# Patient Record
Sex: Male | Born: 1995 | Race: White | Hispanic: No | Marital: Single | Smoking: Never smoker
Health system: Southern US, Community
[De-identification: ages and names within clinical notes are randomized; demographics above are authoritative.]

## PROBLEM LIST (undated history)

## (undated) DIAGNOSIS — F50819 Binge eating disorder, unspecified: Secondary | ICD-10-CM

## (undated) DIAGNOSIS — F5081 Binge eating disorder: Secondary | ICD-10-CM

## (undated) DIAGNOSIS — F4329 Adjustment disorder with other symptoms: Secondary | ICD-10-CM

## (undated) HISTORY — DX: Binge eating disorder: F50.81

## (undated) HISTORY — DX: Binge eating disorder, unspecified: F50.819

## (undated) HISTORY — PX: KIDNEY SURGERY: SHX687

## (undated) HISTORY — DX: Adjustment disorder with other symptoms: F43.29

---
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
## 2014-10-18 ENCOUNTER — Encounter: Payer: Self-pay | Admitting: Family Medicine

## 2014-10-18 ENCOUNTER — Ambulatory Visit (INDEPENDENT_AMBULATORY_CARE_PROVIDER_SITE_OTHER): Payer: BC Managed Care – PPO | Admitting: Family Medicine

## 2014-10-18 VITALS — BP 116/77 | HR 83 | Temp 98.8°F | Resp 16 | Ht 66.75 in | Wt 159.0 lb

## 2014-10-18 DIAGNOSIS — R41840 Attention and concentration deficit: Secondary | ICD-10-CM

## 2014-10-18 DIAGNOSIS — F508 Other eating disorders: Secondary | ICD-10-CM | POA: Diagnosis not present

## 2014-10-18 DIAGNOSIS — F411 Generalized anxiety disorder: Secondary | ICD-10-CM | POA: Diagnosis not present

## 2014-10-18 DIAGNOSIS — F5081 Binge eating disorder: Secondary | ICD-10-CM

## 2014-10-18 MED ORDER — LISDEXAMFETAMINE DIMESYLATE 30 MG PO CAPS: 30.0000 mg | ORAL_CAPSULE | Freq: Every day | ORAL | Status: DC

## 2014-10-18 NOTE — Progress Notes (Signed)
Office Note 10/18/2014  CC:  Chief Complaint  Patient presents with  . Establish Care   HPI:  Brian Benson is a 19 y.o. White male who is here to establish care. Patient's most recent primary MD: Dr. Yehuda Budd (no longer practicing medicine). Old records were not reviewed prior to or during today's visit.  Here today b/c he is returning to college today and has concerns that he has ADD. Says he struggled with focus since HS, started affecting his grades since college.  Trouble grasping info in lectures.   No oppositinal/defiance tendencies.  Never labeled as hyper in HS.  Has some feeling of being driven by a motor. Some probs with impulsivity, talking when he shouldn't be talking.   Gets what he calls stress induced anxiety.  Some social anxiety.  No panic attack but "debilitating stress" in the past. During times of extreme stress he finds it very had to sleep, sometimes is up for 2 days (like during final exam week). Endorces past hx of depression but has never been medicated.  Says no current depressed feelings.     Past Medical History  Diagnosis Date  . Binge eating disorder     in remission as of 10/2014    Past Surgical History  Procedure Laterality Date  . Kidney surgery      82 weeks old---ureteral blockage--repaired and both kidneys essentially work normal since.    Family History  Problem Relation Age of Onset  . Diabetes Mother     Type 1 secondary to pancreatitis  . Arthritis Father   . Stroke Paternal Grandmother   . Heart disease Paternal Grandfather   . Hypertension Paternal Grandfather   . Heart attack Paternal Grandfather     Social History   Social History  . Marital Status: Single    Spouse Name: N/A  . Number of Children: N/A  . Years of Education: N/A   Occupational History  . Not on file.   Social History Main Topics  . Smoking status: Never Smoker   . Smokeless tobacco: Never Used  . Alcohol Use: No  . Drug Use: No  . Sexual  Activity: Not on file   Other Topics Concern  . Not on file   Social History Narrative   Single, no children.   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   No T/A/Ds.   MEDS: none  No Known Allergies  ROS Review of Systems  Constitutional: Negative for fever and fatigue.  HENT: Negative for congestion and sore throat.   Eyes: Negative for visual disturbance.  Respiratory: Negative for cough.   Cardiovascular: Negative for chest pain.  Gastrointestinal: Negative for nausea and abdominal pain.  Genitourinary: Negative for dysuria.  Musculoskeletal: Negative for back pain and joint swelling.  Skin: Negative for rash.  Neurological: Negative for weakness and headaches.  Hematological: Negative for adenopathy.    PE; Blood pressure 116/77, pulse 83, temperature 98.8 F (37.1 C), temperature source Oral, resp. rate 16, height 5' 6.75" (1.695 m), weight 159 lb (72.122 kg), SpO2 98 %. Wt Readings from Last 2 Encounters:  10/18/14 159 lb (72.122 kg)  07/18/12 200 lb (90.719 kg) (94 %*, Z = 1.57)   * Growth percentiles are based on CDC 2-20 Years data.    Gen: alert, oriented x 4, affect pleasant.  Lucid thinking and conversation noted. HEENT: PERRLA, EOMI.   Neck: no LAD, mass, or thyromegaly. CV: RRR, no m/r/g LUNGS: CTA bilat, nonlabored. NEURO: no  tremor or tics noted on observation.  Coordination intact. CN 2-12 grossly intact bilaterally, strength 5/5 in all extremeties.  No ataxia.  Pertinent labs:  none  ASSESSMENT AND PLAN:   GAD with some concentration/focus deficit as symptoms. He also admits to binge eating d/o that he feels like is in remission at this time. Plan: start vyvanse 30mg  qd trial for treatment of binge eating disorder and concentration/focus deficit.  Therapeutic expectations and side effect profile of medication discussed today.  Patient's questions answered. If anxiety is made worse by the vyvanse then will likely do trial of strattera or  an SSRI.  An After Visit Summary was printed and given to the patient.  Return for f/u adhd/anxiety in 3-4 wks.

## 2014-10-18 NOTE — Progress Notes (Signed)
Pre visit review using our clinic review tool, if applicable. No additional management support is needed unless otherwise documented below in the visit note. 

## 2014-10-31 ENCOUNTER — Telehealth: Payer: Self-pay | Admitting: *Deleted

## 2014-10-31 IMAGING — CR DG SHOULDER 2+V*L*
3 series · 3 of 3 positions shown · non-contrast
Comparison: None available.

CLINICAL DATA: Pain.  Trauma the left shoulder 3 days ago.

LEFT SHOULDER - 2+ VIEW

[view not recorded (1 of 3)]
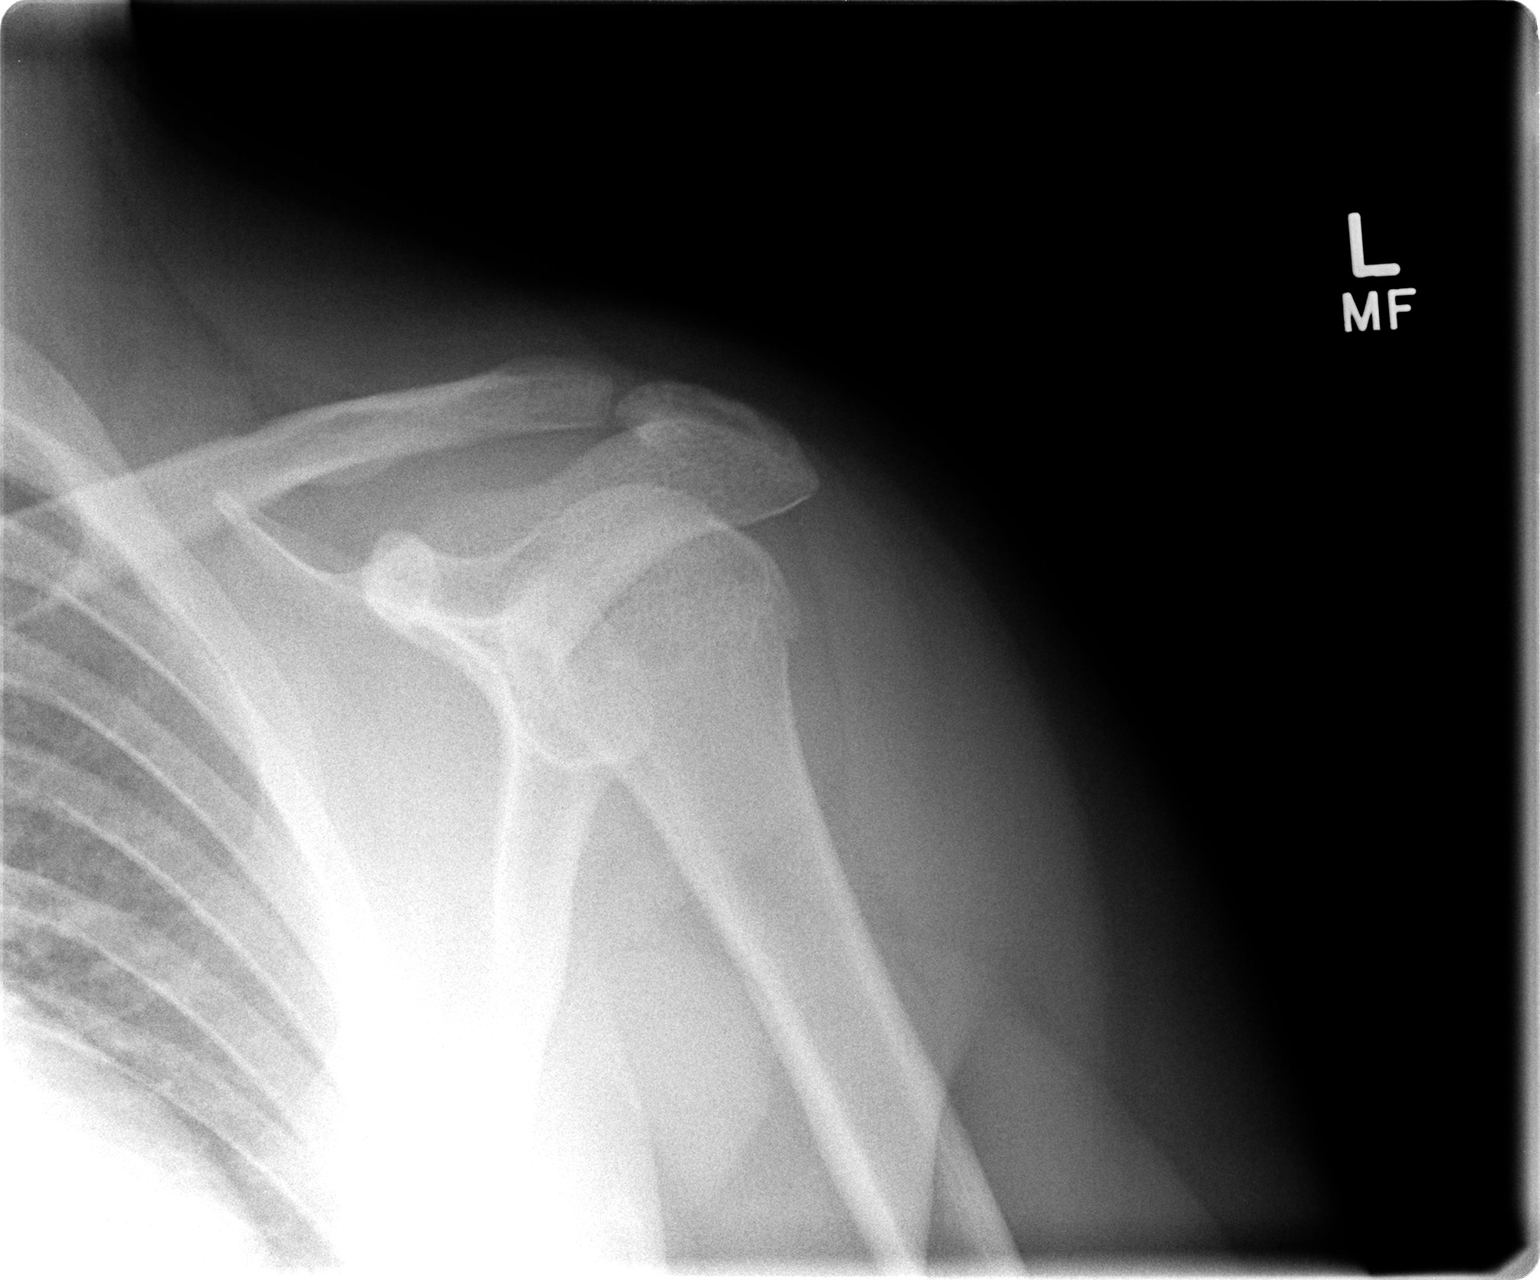

[view not recorded (2 of 3)]
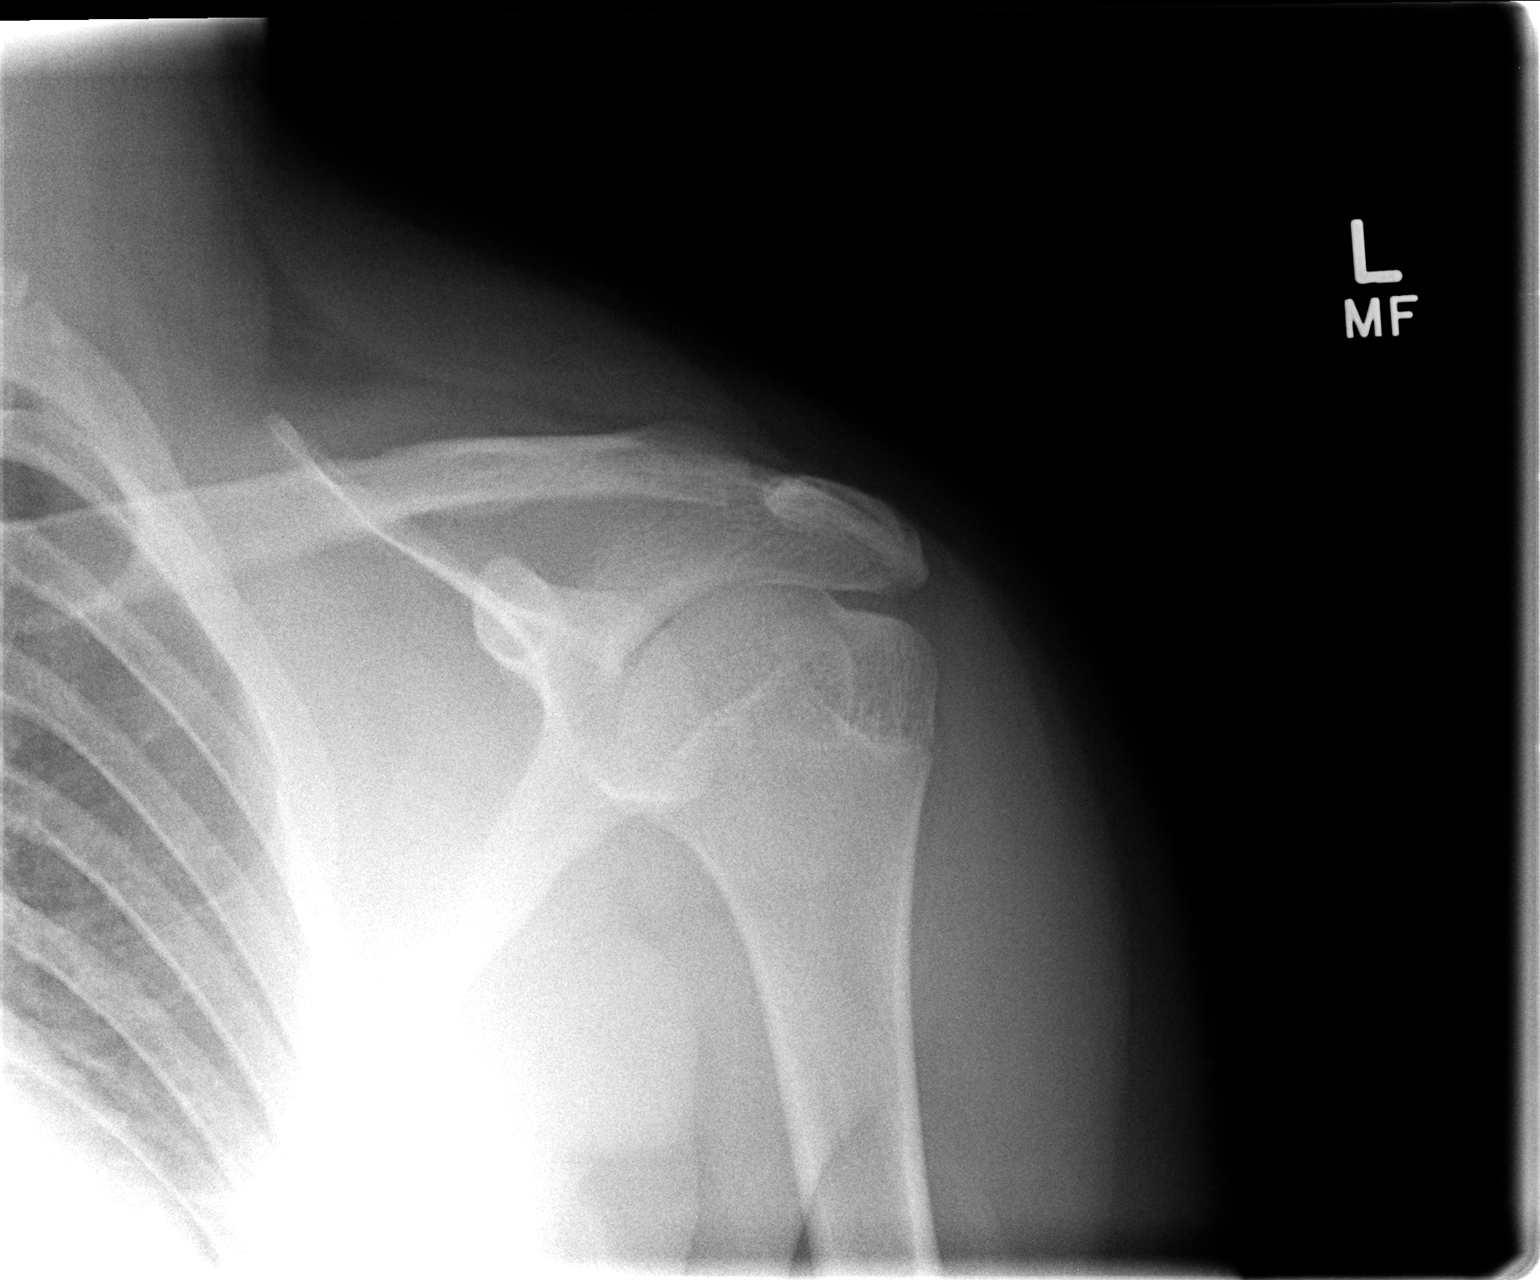

[view not recorded (3 of 3)]
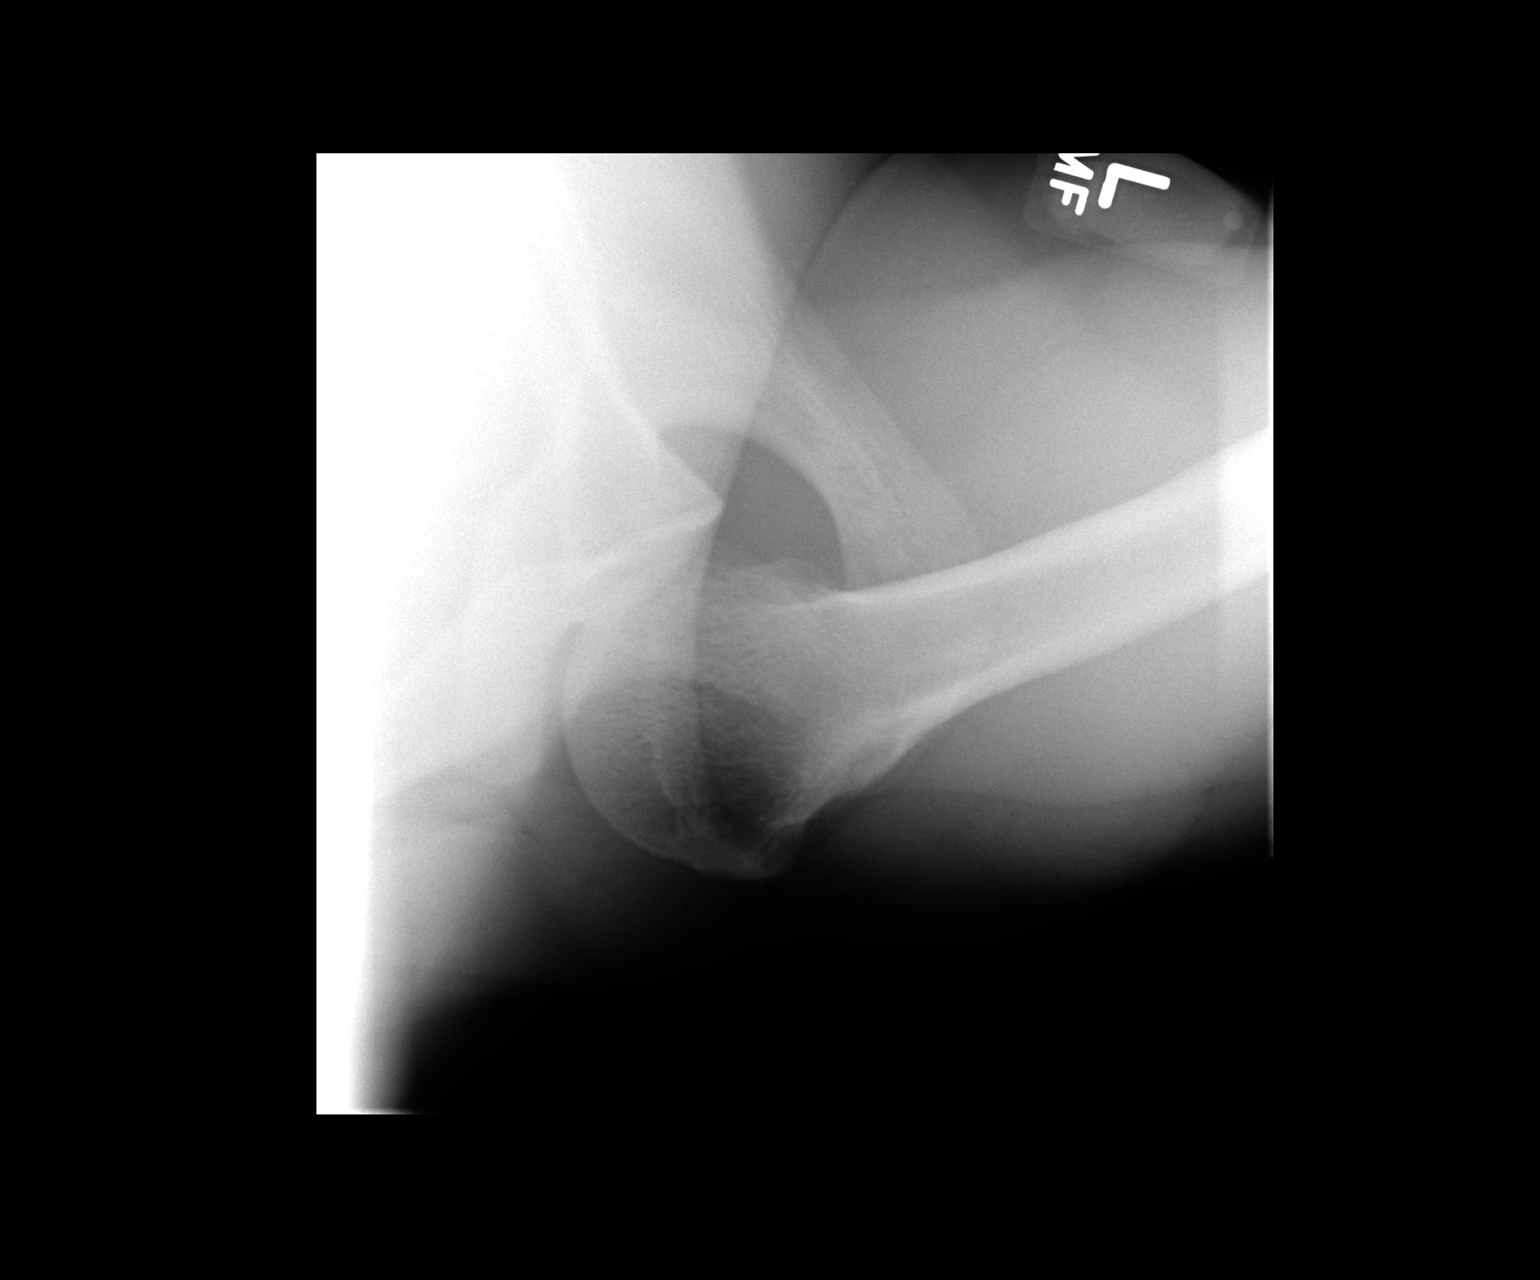

[3 of 3 positions shown; findings below may reference images not displayed]

FINDINGS: The left shoulder is located.  No acute bone or soft
tissue abnormalities are present.  The visualized hemithorax is
clear.
IMPRESSION: Negative left shoulder radiographs.

## 2014-10-31 NOTE — Telephone Encounter (Signed)
Fax from [facility] stating PA for Vyvanse has been denied. Reason being pt needs to try and/or fail citalopram, escitalopram, fluoxetine, paroxetine, sertraline, imipramine, topiramate or zonisamide. Please advise. Thanks. Pts mother has been advised of denial for PA.

## 2014-11-01 ENCOUNTER — Other Ambulatory Visit: Payer: Self-pay | Admitting: Family Medicine

## 2014-11-01 MED ORDER — CITALOPRAM HYDROBROMIDE 20 MG PO TABS: 20.0000 mg | ORAL_TABLET | Freq: Every day | ORAL | Status: DC

## 2014-11-01 NOTE — Telephone Encounter (Signed)
Pls notify pt that his insurance would not approve vyvanse for him. He first has to try a different med and if it doesn't help in 1 month or so then we can try to get vyvanse approved again. I did a rx for citalopram but just need him to tell us what pharmacy to send it to: tell him it should begin to help in about 3 weeks with his anxiety and concentration/focus problems and he needs to take it EVERY DAY.-thx

## 2014-11-01 NOTE — Telephone Encounter (Signed)
Rx sent to [facility] as requested. Pts mother advised and voiced understanding, okay per DPR.

## 2014-12-01 ENCOUNTER — Encounter: Payer: Self-pay | Admitting: Family Medicine

## 2014-12-01 ENCOUNTER — Ambulatory Visit (INDEPENDENT_AMBULATORY_CARE_PROVIDER_SITE_OTHER): Payer: BC Managed Care – PPO | Admitting: Family Medicine

## 2014-12-01 VITALS — BP 116/60 | HR 77 | Temp 97.4°F | Wt 173.4 lb

## 2014-12-01 DIAGNOSIS — F909 Attention-deficit hyperactivity disorder, unspecified type: Secondary | ICD-10-CM

## 2014-12-01 DIAGNOSIS — F411 Generalized anxiety disorder: Secondary | ICD-10-CM

## 2014-12-01 DIAGNOSIS — F900 Attention-deficit hyperactivity disorder, predominantly inattentive type: Secondary | ICD-10-CM | POA: Diagnosis not present

## 2014-12-01 MED ORDER — CITALOPRAM HYDROBROMIDE 20 MG PO TABS: 20.0000 mg | ORAL_TABLET | Freq: Every day | ORAL | Status: DC

## 2014-12-01 MED ORDER — METHYLPHENIDATE HCL ER (LA) 10 MG PO CP24: 10.0000 mg | ORAL_CAPSULE | Freq: Every day | ORAL | Status: DC

## 2014-12-01 NOTE — Progress Notes (Signed)
OFFICE NOTE  12/01/2014  CC:  Chief Complaint  Patient presents with  . Follow-up  . Anxiety  . ADHD    HPI: Patient is a 19 y.o. Caucasian male who is here for 6 week f/u GAD with ADD sx's as well, also hx of binge eating d/o. Med x 1 mo, has helped with general sense of well being, less anxious.  Eating is normal, no pattern of binging at all. Sleep is fine.  No depressed mood.  Feels like his stress level is down.  No side effect from med.  He is back in school: has mostly afternoon classes and does school related work up until about 8-9 pm on avg. This is a period of time he feels like his concentration/focus are impaired to a degree that it is impacting his performance.  Pertinent PMH:  Past medical, surgical, social, and family history reviewed and no changes are noted since last office visit.  MEDS:  Outpatient Prescriptions Prior to Visit  Medication Sig Dispense Refill  . citalopram (CELEXA) 20 MG tablet Take 1 tablet (20 mg total) by mouth daily. 30 tablet 1   No facility-administered medications prior to visit.    PE: Blood pressure 116/60, pulse 77, temperature 97.4 F (36.3 C), temperature source Oral, weight 173 lb 6.4 oz (78.654 kg), SpO2 97 %. Wt Readings from Last 2 Encounters:  12/01/14 173 lb 6.4 oz (78.654 kg)  10/18/14 159 lb (72.122 kg)  **Pt states he eats a lot more when he is in school, which he started about 1 mo ago  Gen: alert, oriented x 4, affect pleasant.  Lucid thinking and conversation noted. HEENT: PERRLA, EOMI.   Neck: no LAD, mass, or thyromegaly. CV: RRR, no m/r/g LUNGS: CTA bilat, nonlabored. NEURO: no tremor or tics noted on observation.  Coordination intact. CN 2-12 grossly intact bilaterally, strength 5/5 in all extremeties.  No ataxia.   IMPRESSION AND PLAN:  1) GAD-improving, continue citalopram at  qd dosing. 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 Therapeutic  expectations and side effect profile of medication discussed today.  Patient's questions answered.  An After Visit Summary was printed and given to the patient.  FOLLOW UP: 1 mo

## 2015-04-06 ENCOUNTER — Telehealth: Payer: Self-pay | Admitting: Family Medicine

## 2015-04-06 NOTE — Telephone Encounter (Signed)
[facility] [place], the pharmacy tech states that Ritalin nor Vyvanse was ever filled at that pharmacy but celexa was filled one time on 11/01/14 but no others were filled.

## 2015-04-06 NOTE — Telephone Encounter (Addendum)
Patient's father requesting a CB to discuss ADHD.  Patient's father states pt does not have ADHD and wants this removed from his chart.  Pt's father also stated that pt never filled any of the Rx's that were written for ADHD or depression.  I will call the pharmacy to verify.  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  I told pt's father that it would not be something that could probably be removed from chart.  I also told him to schedule appointment to discuss.  I will schedule 30 minute OV but I wanted you to be aware.

## 2015-04-07 NOTE — Telephone Encounter (Signed)
Noted  

## 2015-04-19 ENCOUNTER — Encounter: Payer: Self-pay | Admitting: Family Medicine

## 2015-04-19 ENCOUNTER — Ambulatory Visit (INDEPENDENT_AMBULATORY_CARE_PROVIDER_SITE_OTHER): Payer: BC Managed Care – PPO | Admitting: Family Medicine

## 2015-04-19 VITALS — BP 112/70 | HR 86 | Temp 98.2°F | Resp 16 | Ht 66.75 in | Wt 171.2 lb

## 2015-04-19 DIAGNOSIS — F4329 Adjustment disorder with other symptoms: Secondary | ICD-10-CM

## 2015-04-19 NOTE — Progress Notes (Signed)
Pre visit review using our clinic review tool, if applicable. No additional management support is needed unless otherwise documented below in the visit note. 

## 2015-04-19 NOTE — Progress Notes (Signed)
OFFICE VISIT  04/19/2015   CC:  Chief Complaint  Patient presents with  . Follow-up    ADHD   HPI:    Patient is a 20 y.o. Caucasian male who presents for f/u for a history of anxiety and concentration problems. Unhappy with school so did not start spring semester. 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  Regarding his anxiety and focus/concentration symptoms he says these have resolved.  He says when he stopped school 02/2015 and moved back home he began feeling "normal" again.  He took only a couple of doses of the citalopram I rx'd and said this made him have thoughts that worried him--such as "feeling dependent on a  Med to feel good".  He did not fill the rx for ritalin LA that I gave him last visit to try to help with his focus issues at that time. Currently he has no significant anxiety, no eating issues, no feeling of poor focus or concentration and no depression or hypomania sx's.      Past Medical History  Diagnosis Date  . Binge eating disorder     in remission as of 10/2014  . GAD (generalized anxiety disorder)     with ADD sx's    Past Surgical History  Procedure Laterality Date  . Kidney surgery      25 weeks old---ureteral blockage--repaired and both kidneys essentially work normal since.    Outpatient Prescriptions Prior to Visit  Medication Sig Dispense Refill  . citalopram (CELEXA) 20 MG tablet Take 1 tablet (20 mg total) by mouth daily. (Patient not taking: Reported on 04/19/2015) 30 tablet 5  . methylphenidate (RITALIN LA) 10 MG 24 hr capsule Take 1 capsule (10 mg total) by mouth daily. (Patient not taking: Reported on 04/19/2015) 30 capsule 0   No facility-administered medications prior to visit.    No Known Allergies  ROS As per HPI  PE: Blood pressure 112/70, pulse 86, temperature 98.2 F (36.8 C), temperature source Oral, resp. rate 16, height 5' 6.75" (1.695 m), weight 171 lb 4 oz (77.678 kg), SpO2 97 %. Wt Readings  from Last 2 Encounters:  04/19/15 171 lb 4 oz (77.678 kg)  12/01/14 173 lb 6.4 oz (78.654 kg)    Gen: alert, oriented x 4, affect pleasant.  Lucid thinking and conversation noted. HEENT: PERRLA, EOMI.   Neck: no LAD, mass, or thyromegaly. CV: RRR, no m/r/g LUNGS: CTA bilat, nonlabored. NEURO: no tremor or tics noted on observation.  Coordination intact. CN 2-12 grossly intact bilaterally, strength 5/5 in all extremeties.  No ataxia.   LABS:  none  IMPRESSION AND PLAN:  Adjustment disorder with anxiety and poor concentration/focus: now resolved. He feels quite well and is optimistic about his future with the [employer]. I don't feel like he ever had a true organic anxiety disorder, nor do I feel like he ever had ADHD. He was having a stress reaction to school and ultimately was not happy doing school at this point. Now that he has chosen a different career route he seems to feel well/back to his normal self.  An After Visit Summary was printed and given to the patient.  FOLLOW UP: Return if symptoms worsen or fail to improve.

## 2017-03-05 ENCOUNTER — Telehealth: Payer: Self-pay | Admitting: Family Medicine

## 2017-03-05 NOTE — Telephone Encounter (Signed)
Dr. Loyola MastMelissa Benson was pt's Pediatrician. [facility] [phone]  Per CRM:   LM for pts mother to call back. (Okay per DPR). Pts mother faxed over a form for us to complete. Need to know who pt saw for his childhood immunizations. Okay for PEC to speak with mother.

## 2017-03-05 NOTE — Telephone Encounter (Signed)
Diane will you please send a record request to Dr. Loyola MastMelissa Lowe. We need a record of pts immunizations. Thanks.

## 2017-03-05 NOTE — Telephone Encounter (Signed)
Faxed request

## 2017-07-17 ENCOUNTER — Ambulatory Visit (INDEPENDENT_AMBULATORY_CARE_PROVIDER_SITE_OTHER): Payer: No Typology Code available for payment source | Admitting: Family Medicine

## 2017-07-17 ENCOUNTER — Encounter: Payer: Self-pay | Admitting: Family Medicine

## 2017-07-17 VITALS — BP 133/74 | HR 75 | Temp 98.0°F | Resp 16 | Ht 66.75 in | Wt 171.4 lb

## 2017-07-17 DIAGNOSIS — L237 Allergic contact dermatitis due to plants, except food: Secondary | ICD-10-CM | POA: Diagnosis not present

## 2017-07-17 MED ORDER — PREDNISONE 20 MG PO TABS: ORAL_TABLET | ORAL | 0 refills | Status: AC

## 2017-07-17 NOTE — Progress Notes (Signed)
OFFICE VISIT  07/17/2017   CC:  Chief Complaint  Patient presents with  . Rash    Poison Ivy?   HPI:    Patient is a 22 y.o.  male who presents for rash. Onset 3 d/a, itchy, progressively worsening. Working in yard, suspects he was exposed to poison ivy day prior to onset of rash. Started on L arm and both lower legs.  Now is involving both arms, both legs, L shoulder, L side. None in groin area or abd/trunk.  Calamine and topical benadryl help.  Oral antihistamine 2 nights ago no help. No f/c/malaise. He does have hx of significant rash like this to poison ivy in the past.  Past Medical History:  Diagnosis Date  . Adjustment disorder with other symptom    anxiety and poor focus/concentration  . Binge eating disorder    in remission as of 10/2014    Past Surgical History:  Procedure Laterality Date  . KIDNEY SURGERY     93 weeks old---ureteral blockage--repaired and both kidneys essentially work normal since.    No outpatient medications prior to visit.   No facility-administered medications prior to visit.     No Known Allergies  ROS As per HPI  PE: Blood pressure 133/74, pulse 75, temperature 98 F (36.7 C), temperature source Oral, resp. rate 16, height 5' 6.75" (1.695 m), weight 171 lb 6 oz (77.7 kg), SpO2 98 %. Body mass index is 27.04 kg/m.  Gen: Alert, well appearing.  Patient is oriented to person, place, time, and situation. AFFECT: pleasant, lucid thought and speech. SKIN: fine papular rash--pinkish lesions--on both arms and lower legs, L side, and L shoulder.  +Blanchable. No petechiae, no vesicles, no pustules.  Non-tender to touch.  No hives.  LABS:    Chemistry   No results found for: NA, K, CL, CO2, BUN, CREATININE, GLU No results found for: CALCIUM, ALKPHOS, AST, ALT, BILITOT     IMPRESSION AND PLAN:  Allergic contact dermatitis; prednisone taper (40 qd x 5d, 20 qd x 5d, 10 qd x 4d). Buy generic over the counter zyrtec (called cetirizine)  and follow dosing instructions on the packaging. Take this daily for the next 10 days.  An After Visit Summary was printed and given to the patient.  FOLLOW UP: Return if symptoms worsen or fail to improve.  Signed:  Santiago Bumpers, MD           07/17/2017

## 2017-07-17 NOTE — Patient Instructions (Signed)
Buy generic over the counter zyrtec (called cetirizine) and follow dosing instructions on the packaging. Take this daily for the next 10 days.
